# Patient Record
Sex: Female | Born: 2000 | Race: Black or African American | Hispanic: No | Marital: Single | Smoking: Never smoker
Health system: Southern US, Community
[De-identification: ages and names within clinical notes are randomized; demographics above are authoritative.]

## PROBLEM LIST (undated history)

## (undated) DIAGNOSIS — K5190 Ulcerative colitis, unspecified, without complications: Secondary | ICD-10-CM

## (undated) DIAGNOSIS — K900 Celiac disease: Secondary | ICD-10-CM

## (undated) DIAGNOSIS — G44209 Tension-type headache, unspecified, not intractable: Secondary | ICD-10-CM

## (undated) DIAGNOSIS — T7840XA Allergy, unspecified, initial encounter: Secondary | ICD-10-CM

## (undated) HISTORY — PX: WISDOM TOOTH EXTRACTION: SHX21

## (undated) HISTORY — DX: Tension-type headache, unspecified, not intractable: G44.209

## (undated) HISTORY — DX: Celiac disease: K90.0

## (undated) HISTORY — DX: Ulcerative colitis, unspecified, without complications: K51.90

---
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
## 2020-11-27 ENCOUNTER — Other Ambulatory Visit: Payer: Self-pay | Admitting: Obstetrics & Gynecology

## 2020-11-27 NOTE — Telephone Encounter (Signed)
Annual exam scheduled on 01/19/21

## 2021-01-15 ENCOUNTER — Ambulatory Visit: Payer: 59 | Admitting: Obstetrics & Gynecology

## 2021-01-19 ENCOUNTER — Other Ambulatory Visit: Payer: Self-pay

## 2021-01-19 ENCOUNTER — Ambulatory Visit (INDEPENDENT_AMBULATORY_CARE_PROVIDER_SITE_OTHER): Payer: 59 | Admitting: Obstetrics & Gynecology

## 2021-01-19 ENCOUNTER — Encounter: Payer: Self-pay | Admitting: Obstetrics & Gynecology

## 2021-01-19 VITALS — BP 110/70 | Ht 63.25 in | Wt 122.0 lb

## 2021-01-19 DIAGNOSIS — D500 Iron deficiency anemia secondary to blood loss (chronic): Secondary | ICD-10-CM

## 2021-01-19 DIAGNOSIS — N921 Excessive and frequent menstruation with irregular cycle: Secondary | ICD-10-CM

## 2021-01-19 DIAGNOSIS — Z30015 Encounter for initial prescription of vaginal ring hormonal contraceptive: Secondary | ICD-10-CM

## 2021-01-19 DIAGNOSIS — Z01419 Encounter for gynecological examination (general) (routine) without abnormal findings: Secondary | ICD-10-CM

## 2021-01-19 DIAGNOSIS — K51211 Ulcerative (chronic) proctitis with rectal bleeding: Secondary | ICD-10-CM

## 2021-01-19 MED ORDER — ETONOGESTREL-ETHINYL ESTRADIOL 0.12-0.015 MG/24HR VA RING: 1.0000 | VAGINAL_RING | VAGINAL | 4 refills | Status: DC

## 2021-01-19 NOTE — Progress Notes (Signed)
Shawna Gibson August 03, 2001 [id number]   History:    20 y.o. G0 Single.  Boyfriend x 2 years.  In [place].  RP:  Established patient presenting for annual gyn exam   HPI: On continuous BCPs with Tri-Lo-Sprintec, but frequent BTB.  Would like to change BC.  No pelvic pain.  No pain with IC.  Previous STI screen negative.  Breasts normal.  BMI 21.44.  Good fitness.  Ulcerative Colitis controled on treatment.  Planning a Study abroad in [place] this summer.  Past medical history,surgical history, family history and social history were all reviewed and documented in the EPIC chart.  Gynecologic History Patient's last menstrual period was 01/16/2021.  Obstetric History OB History  Gravida Para Term Preterm AB Living  0 0 0 0 0 0  SAB IAB Ectopic Multiple Live Births  0 0 0 0 0     ROS: A ROS was performed and pertinent positives and negatives are included in the history.  GENERAL: No fevers or chills. HEENT: No change in vision, no earache, sore throat or sinus congestion. NECK: No pain or stiffness. CARDIOVASCULAR: No chest pain or pressure. No palpitations. PULMONARY: No shortness of breath, cough or wheeze. GASTROINTESTINAL: No abdominal pain, nausea, vomiting or diarrhea, melena or bright red blood per rectum. GENITOURINARY: No urinary frequency, urgency, hesitancy or dysuria. MUSCULOSKELETAL: No joint or muscle pain, no back pain, no recent trauma. DERMATOLOGIC: No rash, no itching, no lesions. ENDOCRINE: No polyuria, polydipsia, no heat or cold intolerance. No recent change in weight. HEMATOLOGICAL: No anemia or easy bruising or bleeding. NEUROLOGIC: No headache, seizures, numbness, tingling or weakness. PSYCHIATRIC: No depression, no loss of interest in normal activity or change in sleep pattern.     Exam:   BP 110/70   Ht 5' 3.25" (1.607 m)   Wt 122 lb (55.3 kg)   LMP 01/16/2021 Comment: PILL  BMI 21.44 kg/m   Body mass index is 21.44 kg/m.  General appearance : Well  developed well nourished female. No acute distress HEENT: Eyes: no retinal hemorrhage or exudates,  Neck supple, trachea midline, no carotid bruits, no thyroidmegaly Lungs: Clear to auscultation, no rhonchi or wheezes, or rib retractions  Heart: Regular rate and rhythm, no murmurs or gallops Breast:Examined in sitting and supine position were symmetrical in appearance, no palpable masses or tenderness,  no skin retraction, no nipple inversion, no nipple discharge, no skin discoloration, no axillary or supraclavicular lymphadenopathy Abdomen: no palpable masses or tenderness, no rebound or guarding Extremities: no edema or skin discoloration or tenderness  Pelvic: Vulva: Normal             Vagina: No gross lesions or discharge  Cervix: No gross lesions or discharge  Uterus  AV, normal size, shape and consistency, non-tender and mobile  Adnexa  Without masses or tenderness  Anus: Normal   Assessment/Plan:  20 y.o. female for annual exam   1. Well female exam with routine gynecological exam Normal gynecologic exam.  No indication for Pap test given 20 year old.  Breast exam normal.  Good body mass index at 21.44.  Good fitness and nutrition.  2. Breakthrough bleeding on birth control pills Breakthrough bleeding on birth control pills.  Counseling on contraception done.  Decision to start on the NuvaRing.  Usage reviewed.  No contraindication.  3. Encounter for initial prescription of vaginal ring hormonal contraceptive As above.  Prescription sent to pharmacy.  4. Iron deficiency anemia due to chronic blood loss History of anemia with iron  deficiency associated with ulcerative colitis.  Will verify CBC and iron panel as well as vitamin D today. - CBC - Iron, TIBC and Ferritin Panel - Vitamin D 1,25 dihydroxy  5. Ulcerative proctitis with rectal bleeding (HCC) Under treatment.  Other orders - Ustekinumab (STELARA) 45 MG/0.5ML SOLN; Inject into the skin. - etonogestrel-ethinyl  estradiol (NUVARING) 0.12-0.015 MG/24HR vaginal ring; Place 1 each vaginally every 28 (twenty-eight) days. Insert vaginally and leave in place for 3 consecutive weeks, then remove for 1 week.  Princess Bruins MD, 12:17 PM 01/19/2021

## 2021-01-20 LAB — CBC: RDW: 11.8 % (ref 11.0–15.0)

## 2021-01-22 ENCOUNTER — Encounter: Payer: Self-pay | Admitting: Anesthesiology

## 2021-01-25 ENCOUNTER — Encounter: Payer: Self-pay | Admitting: Obstetrics & Gynecology

## 2021-01-25 LAB — CBC
HCT: 38.6 % (ref 35.0–45.0)
Hemoglobin: 12.9 g/dL (ref 11.7–15.5)
MCH: 30.5 pg (ref 27.0–33.0)
MCHC: 33.4 g/dL (ref 32.0–36.0)
MCV: 91.3 fL (ref 80.0–100.0)
MPV: 9.5 fL (ref 7.5–12.5)
Platelets: 330 10*3/uL (ref 140–400)
RBC: 4.23 10*6/uL (ref 3.80–5.10)
WBC: 4.1 10*3/uL (ref 3.8–10.8)

## 2021-01-25 LAB — VITAMIN D 1,25 DIHYDROXY
Vitamin D 1, 25 (OH)2 Total: 47 pg/mL (ref 18–72)
Vitamin D2 1, 25 (OH)2: <8 pg/mL
Vitamin D3 1, 25 (OH)2: 47 pg/mL

## 2021-01-25 LAB — IRON,TIBC AND FERRITIN PANEL
%SAT: 21 % (calc) (ref 15–45)
Ferritin: 24 ng/mL (ref 16–154)
Iron: 82 ug/dL (ref 27–164)
TIBC: 386 mcg/dL (calc) (ref 271–448)

## 2021-09-29 ENCOUNTER — Ambulatory Visit (INDEPENDENT_AMBULATORY_CARE_PROVIDER_SITE_OTHER): Payer: 59 | Admitting: Obstetrics & Gynecology

## 2021-09-29 ENCOUNTER — Encounter: Payer: Self-pay | Admitting: Obstetrics & Gynecology

## 2021-09-29 ENCOUNTER — Other Ambulatory Visit: Payer: Self-pay

## 2021-09-29 VITALS — BP 116/74

## 2021-09-29 DIAGNOSIS — N921 Excessive and frequent menstruation with irregular cycle: Secondary | ICD-10-CM

## 2021-09-29 DIAGNOSIS — Z975 Presence of (intrauterine) contraceptive device: Secondary | ICD-10-CM | POA: Diagnosis not present

## 2021-09-29 NOTE — Progress Notes (Signed)
    Shawna Gibson 2001-05-04 [id number]        20 y.o.  G0   RP: BTB on continuous Nuvaring  HPI: Had BTB on continuous Nuvaring last time at the end of Oct 2022.  Happens occasionally.  Around the same time, had spotting after orgasm with clitoral stimulation.  No coitarche.  No pelvic pain.  No abnormal vaginal discharge.   OB History  Gravida Para Term Preterm AB Living  0 0 0 0 0 0  SAB IAB Ectopic Multiple Live Births  0 0 0 0 0    Past medical history,surgical history, problem list, medications, allergies, family history and social history were all reviewed and documented in the EPIC chart.   Directed ROS with pertinent positives and negatives documented in the history of present illness/assessment and plan.  Exam:  Vitals:   09/29/21 1439  BP: 116/74   General appearance:  Normal  Abdomen: Normal  Gynecologic exam: Vulva normal.  Speculum:  Cervix/Vagina normal.  No lesion.  Normal secretions.   Assessment/Plan:  20 y.o. G0P0000   1. Breakthrough bleeding with NuvaRing  Specialist and mild breakthrough bleeding on continuous NuvaRing.  Otherwise tolerating the NuvaRing very well.  Recommend taking a 5-day off the ring when breakthrough bleeding occurs.  Patient satisfied with that option.  Patient will contact me if the breakthrough bleeding worsens.  Princess Bruins MD, 2:44 PM 09/29/2021

## 2022-03-04 ENCOUNTER — Other Ambulatory Visit: Payer: Self-pay | Admitting: Obstetrics & Gynecology

## 2022-03-10 ENCOUNTER — Encounter: Payer: Self-pay | Admitting: Obstetrics & Gynecology

## 2022-03-11 ENCOUNTER — Other Ambulatory Visit: Payer: Self-pay

## 2022-03-11 DIAGNOSIS — Z3044 Encounter for surveillance of vaginal ring hormonal contraceptive device: Secondary | ICD-10-CM

## 2022-03-11 MED ORDER — ETONOGESTREL-ETHINYL ESTRADIOL 0.12-0.015 MG/24HR VA RING: 1.0000 | VAGINAL_RING | VAGINAL | 0 refills | Status: DC

## 2022-05-03 ENCOUNTER — Telehealth: Payer: Self-pay | Admitting: *Deleted

## 2022-05-03 DIAGNOSIS — Z3044 Encounter for surveillance of vaginal ring hormonal contraceptive device: Secondary | ICD-10-CM

## 2022-05-03 MED ORDER — ETONOGESTREL-ETHINYL ESTRADIOL 0.12-0.015 MG/24HR VA RING: 1.0000 | VAGINAL_RING | VAGINAL | 0 refills | Status: DC

## 2022-05-06 ENCOUNTER — Ambulatory Visit: Payer: 59 | Admitting: Obstetrics & Gynecology

## 2022-07-06 ENCOUNTER — Ambulatory Visit: Payer: Self-pay | Admitting: Obstetrics & Gynecology

## 2022-07-06 ENCOUNTER — Encounter: Payer: Self-pay | Admitting: Obstetrics & Gynecology

## 2022-07-06 DIAGNOSIS — Z0289 Encounter for other administrative examinations: Secondary | ICD-10-CM

## 2022-08-17 ENCOUNTER — Other Ambulatory Visit (HOSPITAL_COMMUNITY)
Admission: RE | Admit: 2022-08-17 | Discharge: 2022-08-17 | Disposition: A | Discharge status: Home / Self Care | Payer: 59 | Source: Ambulatory Visit | Attending: Obstetrics & Gynecology | Admitting: Obstetrics & Gynecology

## 2022-08-17 ENCOUNTER — Ambulatory Visit (INDEPENDENT_AMBULATORY_CARE_PROVIDER_SITE_OTHER): Payer: 59 | Admitting: Obstetrics & Gynecology

## 2022-08-17 ENCOUNTER — Encounter: Payer: Self-pay | Admitting: Obstetrics & Gynecology

## 2022-08-17 VITALS — BP 118/64 | HR 81 | Ht 63.75 in | Wt 133.0 lb

## 2022-08-17 DIAGNOSIS — Z01419 Encounter for gynecological examination (general) (routine) without abnormal findings: Secondary | ICD-10-CM | POA: Diagnosis present

## 2022-08-17 DIAGNOSIS — Z3044 Encounter for surveillance of vaginal ring hormonal contraceptive device: Secondary | ICD-10-CM

## 2022-08-17 MED ORDER — ETONOGESTREL-ETHINYL ESTRADIOL 0.12-0.015 MG/24HR VA RING: 1.0000 | VAGINAL_RING | VAGINAL | 4 refills | Status: DC

## 2022-08-17 NOTE — Progress Notes (Signed)
Shawna Gibson [birth year] [id number]   History:    21 y.o. G0  Applying for Grad school in Quantitative Psychology.  RP:  Established patient presenting for annual gyn exam   HPI: Well on continuous Nuvaring, if starts having mild BTB, removes the ring for 5 days and then restarts on a new Nuvaring.  No pelvic pain.  No pain with IC.  Stable boyfriend.  Pap reflex/Gono-Chlam today.  Breasts normal.  Urine/BMs normal. Colono 06/2020.  BMI 23.01.  Fit and eating well.  Fam PA N. Redmon.  Past medical history,surgical history, family history and social history were all reviewed and documented in the EPIC chart.  Gynecologic History No LMP recorded. (Menstrual status: Other).  Obstetric History OB History  Gravida Para Term Preterm AB Living  0 0 0 0 0 0  SAB IAB Ectopic Multiple Live Births  0 0 0 0 0     ROS: A ROS was performed and pertinent positives and negatives are included in the history.  GENERAL: No fevers or chills. HEENT: No change in vision, no earache, sore throat or sinus congestion. NECK: No pain or stiffness. CARDIOVASCULAR: No chest pain or pressure. No palpitations. PULMONARY: No shortness of breath, cough or wheeze. GASTROINTESTINAL: No abdominal pain, nausea, vomiting or diarrhea, melena or bright red blood per rectum. GENITOURINARY: No urinary frequency, urgency, hesitancy or dysuria. MUSCULOSKELETAL: No joint or muscle pain, no back pain, no recent trauma. DERMATOLOGIC: No rash, no itching, no lesions. ENDOCRINE: No polyuria, polydipsia, no heat or cold intolerance. No recent change in weight. HEMATOLOGICAL: No anemia or easy bruising or bleeding. NEUROLOGIC: No headache, seizures, numbness, tingling or weakness. PSYCHIATRIC: No depression, no loss of interest in normal activity or change in sleep pattern.     Exam:   BP 118/64   Pulse 81   Ht 5' 3.75" (1.619 m)   Wt 133 lb (60.3 kg)   SpO2 98%   BMI 23.01 kg/m   Body mass index is 23.01 kg/m.  General  appearance : Well developed well nourished female. No acute distress HEENT: Eyes: no retinal hemorrhage or exudates,  Neck supple, trachea midline, no carotid bruits, no thyroidmegaly Lungs: Clear to auscultation, no rhonchi or wheezes, or rib retractions  Heart: Regular rate and rhythm, no murmurs or gallops Breast:Examined in sitting and supine position were symmetrical in appearance, no palpable masses or tenderness,  no skin retraction, no nipple inversion, no nipple discharge, no skin discoloration, no axillary or supraclavicular lymphadenopathy Abdomen: no palpable masses or tenderness, no rebound or guarding Extremities: no edema or skin discoloration or tenderness  Pelvic: Vulva: Normal             Vagina: No gross lesions or discharge  Cervix: No gross lesions or discharge.  Pap reflex/Gono-Chlam done.  Uterus  AV, normal size, shape and consistency, non-tender and mobile  Adnexa  Without masses or tenderness  Anus: Normal   Assessment/Plan:  21 y.o. female for annual exam   1. Encounter for routine gynecological examination with Papanicolaou smear of cervix Well on continuous Nuvaring, if starts having mild BTB, removes the ring for 5 days and then restarts on a new Nuvaring.  No pelvic pain.  No pain with IC.  Stable boyfriend.  Pap reflex/Gono-Chlam today.  Breasts normal.  Urine/BMs normal. Colono 06/2020.  BMI 23.01.  Fit and eating well.  Fam PA N. Redmon. - Cytology - PAP( [place])  2. Encounter for surveillance of vaginal ring hormonal contraceptive device  Well on continuous Nuvaring, if starts having mild BTB, removes the ring for 5 days and then restarts on a new Nuvaring.  No pelvic pain.  No pain with IC.  Nuvaring prescription sent to pharmacy. - etonogestrel-ethinyl estradiol (NUVARING) 0.12-0.015 MG/24HR vaginal ring; Place 1 each vaginally every 28 (twenty-eight) days. Insert vaginally and leave in place for 4 consecutive weeks, then switch ring.  Other orders -  FLUoxetine (PROZAC) 20 MG capsule; Take 20 mg by mouth daily. - tiZANidine (ZANAFLEX) 2 MG tablet; Take by mouth. - STELARA 90 MG/ML SOSY injection; Inject into the skin. - fexofenadine (ALLEGRA) 180 MG tablet; Take 180 mg by mouth daily. - UNABLE TO FIND; daily. Med Name: laxative - Probiotic Product (PROBIOTIC PO); Take by mouth.   Princess Bruins MD, 3:18 PM 08/17/2022

## 2022-08-18 LAB — CYTOLOGY - PAP
Chlamydia: NEGATIVE
Comment: NEGATIVE
Comment: NORMAL
Diagnosis: NEGATIVE
Neisseria Gonorrhea: NEGATIVE

## 2023-06-05 ENCOUNTER — Encounter: Payer: Self-pay | Admitting: Obstetrics & Gynecology

## 2023-06-06 NOTE — Telephone Encounter (Signed)
Routing to Dr Quincy Simmonds. Encounter closed.

## 2023-07-22 ENCOUNTER — Other Ambulatory Visit: Payer: Self-pay | Admitting: Obstetrics & Gynecology

## 2023-07-22 DIAGNOSIS — Z3044 Encounter for surveillance of vaginal ring hormonal contraceptive device: Secondary | ICD-10-CM

## 2023-10-03 ENCOUNTER — Other Ambulatory Visit: Payer: Self-pay | Admitting: Obstetrics & Gynecology

## 2023-10-03 DIAGNOSIS — Z3044 Encounter for surveillance of vaginal ring hormonal contraceptive device: Secondary | ICD-10-CM

## 2023-10-03 NOTE — Telephone Encounter (Signed)
Med refill request: eluryng 0.12-0.015mg /24 hr vaginal ring Last AEX: 01/19/21 Next AEX: not scheduled, sent front desk message to schedule annual visit. Last MMG (if hormonal med) Refill authorized: Last Rx sent nuvaring #3 with 4 refills. Please approve or deny as appropriate.
